# Patient Record
Sex: Female | Born: 1935 | Race: White | Hispanic: No | Marital: Married | Smoking: Never smoker
Health system: Southern US, Community
[De-identification: ages and names within clinical notes are randomized; demographics above are authoritative.]

## PROBLEM LIST (undated history)

## (undated) HISTORY — PX: GASTRIC BYPASS: SHX52

## (undated) HISTORY — PX: EYE SURGERY: SHX253

## (undated) HISTORY — PX: CHOLECYSTECTOMY: SHX55

## (undated) HISTORY — PX: CORONARY ANGIOPLASTY WITH STENT PLACEMENT: SHX49

## (undated) HISTORY — PX: ABDOMINAL HYSTERECTOMY: SHX81

## (undated) HISTORY — PX: LUNG CANCER SURGERY: SHX702

---

## 2014-04-02 ENCOUNTER — Emergency Department (HOSPITAL_COMMUNITY)
Admission: EM | Admit: 2014-04-02 | Discharge: 2014-04-02 | Disposition: A | Discharge status: Home / Self Care | Payer: Medicare (Managed Care) | Attending: Emergency Medicine | Admitting: Emergency Medicine

## 2014-04-02 ENCOUNTER — Emergency Department (HOSPITAL_COMMUNITY): Payer: Medicare (Managed Care)

## 2014-04-02 ENCOUNTER — Encounter (HOSPITAL_COMMUNITY): Payer: Self-pay | Admitting: Cardiology

## 2014-04-02 DIAGNOSIS — Y998 Other external cause status: Secondary | ICD-10-CM | POA: Diagnosis not present

## 2014-04-02 DIAGNOSIS — S79912A Unspecified injury of left hip, initial encounter: Secondary | ICD-10-CM | POA: Diagnosis not present

## 2014-04-02 DIAGNOSIS — Z9861 Coronary angioplasty status: Secondary | ICD-10-CM | POA: Diagnosis not present

## 2014-04-02 DIAGNOSIS — Z791 Long term (current) use of non-steroidal anti-inflammatories (NSAID): Secondary | ICD-10-CM | POA: Diagnosis not present

## 2014-04-02 DIAGNOSIS — Z8739 Personal history of other diseases of the musculoskeletal system and connective tissue: Secondary | ICD-10-CM | POA: Diagnosis not present

## 2014-04-02 DIAGNOSIS — W010XXA Fall on same level from slipping, tripping and stumbling without subsequent striking against object, initial encounter: Secondary | ICD-10-CM | POA: Diagnosis not present

## 2014-04-02 DIAGNOSIS — M25559 Pain in unspecified hip: Secondary | ICD-10-CM

## 2014-04-02 DIAGNOSIS — Z96643 Presence of artificial hip joint, bilateral: Secondary | ICD-10-CM | POA: Insufficient documentation

## 2014-04-02 DIAGNOSIS — S79911A Unspecified injury of right hip, initial encounter: Secondary | ICD-10-CM | POA: Diagnosis not present

## 2014-04-02 DIAGNOSIS — S79922A Unspecified injury of left thigh, initial encounter: Secondary | ICD-10-CM | POA: Diagnosis not present

## 2014-04-02 DIAGNOSIS — Z79899 Other long term (current) drug therapy: Secondary | ICD-10-CM | POA: Insufficient documentation

## 2014-04-02 DIAGNOSIS — Y9289 Other specified places as the place of occurrence of the external cause: Secondary | ICD-10-CM | POA: Diagnosis not present

## 2014-04-02 DIAGNOSIS — I2510 Atherosclerotic heart disease of native coronary artery without angina pectoris: Secondary | ICD-10-CM | POA: Insufficient documentation

## 2014-04-02 DIAGNOSIS — Y9389 Activity, other specified: Secondary | ICD-10-CM | POA: Diagnosis not present

## 2014-04-02 MED ORDER — OXYCODONE-ACETAMINOPHEN 5-325 MG PO TABS: 1.0000 | ORAL_TABLET | ORAL | Status: AC | PRN

## 2014-04-02 MED ORDER — IBUPROFEN 800 MG PO TABS: 800.0000 mg | ORAL_TABLET | Freq: Three times a day (TID) | ORAL | Status: DC

## 2014-04-02 MED ORDER — OXYCODONE-ACETAMINOPHEN 5-325 MG PO TABS: 1.0000 | ORAL_TABLET | ORAL | Status: DC | PRN

## 2014-04-02 NOTE — ED Notes (Signed)
Patient ambulatory to restroom  ?

## 2014-04-02 NOTE — ED Notes (Signed)
Larey SeatFell a few days ago.  C/o left hip pain.

## 2014-04-02 NOTE — ED Notes (Signed)
MD at bedside. 

## 2014-04-02 NOTE — ED Provider Notes (Signed)
CSN: [id number]     Arrival date & time 04/02/14  [id number] History  This chart was scribed for Glynn OctaveStephen Alezander Dimaano, MD by Abel PrestoKara Demonbreun, ED Scribe. This patient was seen in room APA05/[facility]05 and the patient's care was started at 10:16 AM.    Chief Complaint  Patient presents with  . Hip Pain    The history is provided by the patient and a relative. No language interpreter was used.    HPI Comments: Rochele PagesMildred R Dehaas is a 78 y.o. female who presents to the Emergency Department complaining of a fall one week ago. Pt states she tripped on a cement curb. Pt states she landed right on her hip. Pt's son noted she was unable to ambulate right away. Pt denies dizziness and LOC.  She notes initial left arm pain which has resolved.   Pt notes associated lateral left hip pain that radiates to her groin with a frequent popping sound heard. Pt took Aleve this morning for relief. PSH includes bilateral hip replacement in 2002. Pt notes PMHx of CAD with stents placed and sciatica. She notes she takes Crestor and Celebrex but no Plavix.  Pt notes pain does not feel like her arthritis. Pt is visiting family for the holidays and will be flying back to [place] in 3 days. Pt denies right hip pain, chest pain, back pain and any other injury.   History reviewed. No pertinent past medical history. Past Surgical History  Procedure Laterality Date  . Coronary angioplasty with stent placement    . Abdominal hysterectomy    . Cholecystectomy    . Eye surgery    . Lung cancer surgery    . Gastric bypass     History reviewed. No pertinent family history. History  Substance Use Topics  . Smoking status: Never Smoker   . Smokeless tobacco: Not on file  . Alcohol Use: No   OB History    No data available     Review of Systems  Cardiovascular: Negative for chest pain.  Musculoskeletal: Positive for arthralgias. Negative for back pain.  Neurological: Negative for dizziness and syncope.   A complete 10 system review of  systems was obtained and all systems are negative except as noted in the HPI and PMH.     Allergies  Prednisone  Home Medications   Prior to Admission medications   Medication Sig Start Date End Date Taking? Authorizing Provider  CALCIUM PO Take 1 tablet by mouth daily.   Yes Historical Provider, MD  celecoxib (CELEBREX) 200 MG capsule Take 1 capsule by mouth daily. 12/31/13  Yes Historical Provider, MD  CRESTOR 10 MG tablet Take 1 tablet by mouth at bedtime. 03/02/14  Yes Historical Provider, MD  Cyanocobalamin (VITAMIN B-12 PO) Take 1 tablet by mouth daily.   Yes Historical Provider, MD  Magnesium Oxide (MAG-OX PO) Take 1 tablet by mouth daily.   Yes Historical Provider, MD  Multiple Vitamins-Minerals (HAIR/SKIN/NAILS PO) Take 1 tablet by mouth daily.   Yes Historical Provider, MD  Multiple Vitamins-Minerals (MULTIVITAMIN PO) Take 1 tablet by mouth daily.   Yes Historical Provider, MD  NAMENDA XR 28 MG CP24 Take 1 tablet by mouth daily. 02/09/14  Yes Historical Provider, MD  naproxen sodium (ANAPROX) 220 MG tablet Take 440 mg by mouth 2 (two) times daily with a meal.   Yes Historical Provider, MD  sertraline (ZOLOFT) 50 MG tablet Take 1 tablet by mouth daily. 01/26/14  Yes Historical Provider, MD  SYNTHROID 75 MCG tablet Take  1 tablet by mouth at bedtime.  01/26/14  Yes Historical Provider, MD  vitamin E 400 UNIT capsule Take 400 Units by mouth daily.   Yes Historical Provider, MD  oxyCODONE-acetaminophen (PERCOCET/ROXICET) 5-325 MG per tablet Take 1 tablet by mouth every 4 (four) hours as needed for severe pain. 04/02/14   Glynn OctaveStephen Alayasia Breeding, MD   BP 127/57 mmHg  Pulse 55  Resp 18  SpO2 97%  LMP  Physical Exam  Constitutional: She is oriented to person, place, and time. She appears well-developed and well-nourished. No distress.  HENT:  Head: Normocephalic and atraumatic.  Mouth/Throat: Oropharynx is clear and moist. No oropharyngeal exudate.  Eyes: Conjunctivae and EOM are normal.  Pupils are equal, round, and reactive to light.  Neck: Normal range of motion. Neck supple.  No meningismus.  Cardiovascular: Normal rate, regular rhythm, normal heart sounds and intact distal pulses.   No murmur heard. Intact distal pulses  Pulmonary/Chest: Effort normal and breath sounds normal. No respiratory distress.  Abdominal: Soft. There is no tenderness. There is no rebound and no guarding.  Musculoskeletal: Normal range of motion. She exhibits tenderness. She exhibits no edema.       Left hip: She exhibits tenderness (left lateral hip and thigh). She exhibits normal range of motion.       Left knee: She exhibits normal range of motion.       Left ankle: She exhibits normal range of motion.       Lumbar back: She exhibits no tenderness.  No distal femur tenderness  Neurological: She is alert and oriented to person, place, and time. No cranial nerve deficit. She exhibits normal muscle tone. Coordination normal.  No ataxia on finger to nose bilaterally. No pronator drift. 5/5 strength throughout. CN 2-12 intact. Negative Romberg. Equal grip strength. Sensation intact. Gait is antalgic.   Skin: Skin is warm.  Psychiatric: She has a normal mood and affect. Her behavior is normal.  Nursing note and vitals reviewed.   ED Course  Procedures (including critical care time) DIAGNOSTIC STUDIES: Oxygen Saturation is 97% on room air, normal by my interpretation.    COORDINATION OF CARE: 10:23 AM Discussed treatment plan with patient at beside, the patient agrees with the plan and has no further questions at this time.   Labs Review Labs Reviewed - No data to display  Imaging Review Dg Lumbar Spine Complete  04/02/2014   CLINICAL DATA:  Left hip pain and back pain for 10 days after a fall. History of bilateral total hip arthroplasties.  EXAM: LEFT HIP - COMPLETE 2+ VIEW; LUMBAR SPINE - COMPLETE 4+ VIEW  COMPARISON:  None.  FINDINGS: Lumbar spine:  Normal alignment of the lumbar  vertebral bodies. Disc spaces and vertebral bodies are maintained. No definite acute lumbar compression fracture. There is moderate to advanced facet disease in the lower lumbar spine without definite pars defects. Surgical changes are noted in the abdomen. The visualized bony pelvis is intact.  Left hip:  There are bilateral hip prosthesis. Bony resorptive changes versus surgical changes involving the medial calcar. There is also mild lucencies in the medial and inferior aspect of the left acetabulum. No other complicating features associated with the prosthesis. No periprosthetic fracture. The pubic symphysis and SI joints are intact. No pelvic fractures.  IMPRESSION: Advanced facet disease involving the lumbar spine but no acute bony findings.  Bony resorptive changes versus postsurgical changes involving the medial inferior acetabulum and medial calcar on the left. No periprosthetic fracture or pelvic  fracture.   Electronically Signed   By: Loralie Champagne M.D.   On: 04/02/2014 11:06   Dg Hip Complete Left  04/02/2014   CLINICAL DATA:  Left hip pain and back pain for 10 days after a fall. History of bilateral total hip arthroplasties.  EXAM: LEFT HIP - COMPLETE 2+ VIEW; LUMBAR SPINE - COMPLETE 4+ VIEW  COMPARISON:  None.  FINDINGS: Lumbar spine:  Normal alignment of the lumbar vertebral bodies. Disc spaces and vertebral bodies are maintained. No definite acute lumbar compression fracture. There is moderate to advanced facet disease in the lower lumbar spine without definite pars defects. Surgical changes are noted in the abdomen. The visualized bony pelvis is intact.  Left hip:  There are bilateral hip prosthesis. Bony resorptive changes versus surgical changes involving the medial calcar. There is also mild lucencies in the medial and inferior aspect of the left acetabulum. No other complicating features associated with the prosthesis. No periprosthetic fracture. The pubic symphysis and SI joints are  intact. No pelvic fractures.  IMPRESSION: Advanced facet disease involving the lumbar spine but no acute bony findings.  Bony resorptive changes versus postsurgical changes involving the medial inferior acetabulum and medial calcar on the left. No periprosthetic fracture or pelvic fracture.   Electronically Signed   By: Loralie Champagne M.D.   On: 04/02/2014 11:06     EKG Interpretation None      MDM   Final diagnoses:  Hip pain   Fall 1 week ago with persistent left hip pain. History of bilateral hip replacements. Visiting from [place]. Did not hit head or lose consciousness.  Intact distal pulses. Tenderness to palpation of left lateral hip and proximal femur. No distal femur tenderness Patient able to ambulate with antalgic gait X-rays are negative for acute fracture or dislocation.  Discussed with patient and son. Offered CT scan to further evaluate for subtle pelvic fracture. They decline stating they will follow up with her orthopedist on Monday. Patient is able to ambulate. she has declined pain medication the ED. Her pain is controlled.      Glynn Octave, MD 04/02/14 [phone]

## 2014-04-02 NOTE — Discharge Instructions (Signed)
Arthralgia Take naproxen for your pain every 12 hours. Takes oxycodone in between as needed for severe pain. Return to your doctor in [place] for a recheck next week. Return to the ED if you develop worsening symptoms or  Are unable to walk.  Your caregiver has diagnosed you as suffering from an arthralgia. Arthralgia means there is pain in a joint. This can come from many reasons including:  Bruising the joint which causes soreness (inflammation) in the joint.  Wear and tear on the joints which occur as we grow older (osteoarthritis).  Overusing the joint.  Various forms of arthritis.  Infections of the joint. Regardless of the cause of pain in your joint, most of these different pains respond to anti-inflammatory drugs and rest. The exception to this is when a joint is infected, and these cases are treated with antibiotics, if it is a bacterial infection. HOME CARE INSTRUCTIONS   Rest the injured area for as long as directed by your caregiver. Then slowly start using the joint as directed by your caregiver and as the pain allows. Crutches as directed may be useful if the ankles, knees or hips are involved. If the knee was splinted or casted, continue use and care as directed. If an stretchy or elastic wrapping bandage has been applied today, it should be removed and re-applied every 3 to 4 hours. It should not be applied tightly, but firmly enough to keep swelling down. Watch toes and feet for swelling, bluish discoloration, coldness, numbness or excessive pain. If any of these problems (symptoms) occur, remove the ace bandage and re-apply more loosely. If these symptoms persist, contact your caregiver or return to this location.  For the first 24 hours, keep the injured extremity elevated on pillows while lying down.  Apply ice for 15-20 minutes to the sore joint every couple hours while awake for the first half day. Then 03-04 times per day for the first 48 hours. Put the ice in a plastic bag  and place a towel between the bag of ice and your skin.  Wear any splinting, casting, elastic bandage applications, or slings as instructed.  Only take over-the-counter or prescription medicines for pain, discomfort, or fever as directed by your caregiver. Do not use aspirin immediately after the injury unless instructed by your physician. Aspirin can cause increased bleeding and bruising of the tissues.  If you were given crutches, continue to use them as instructed and do not resume weight bearing on the sore joint until instructed. Persistent pain and inability to use the sore joint as directed for more than 2 to 3 days are warning signs indicating that you should see a caregiver for a follow-up visit as soon as possible. Initially, a hairline fracture (break in bone) may not be evident on X-rays. Persistent pain and swelling indicate that further evaluation, non-weight bearing or use of the joint (use of crutches or slings as instructed), or further X-rays are indicated. X-rays may sometimes not show a small fracture until a week or 10 days later. Make a follow-up appointment with your own caregiver or one to whom we have referred you. A radiologist (specialist in reading X-rays) may read your X-rays. Make sure you know how you are to obtain your X-ray results. Do not assume everything is normal if you do not hear from us. SEEK MEDICAL CARE IF: Bruising, swelling, or pain increases. SEEK IMMEDIATE MEDICAL CARE IF:   Your fingers or toes are numb or blue.  The pain is not  responding to medications and continues to stay the same or get worse.  The pain in your joint becomes severe.  You develop a fever over 102 F (38.9 C).  It becomes impossible to move or use the joint. MAKE SURE YOU:   Understand these instructions.  Will watch your condition.  Will get help right away if you are not doing well or get worse. Document Released: 03/20/2005 Document Revised: 06/12/2011 Document  Reviewed: 11/06/2007 [facility] Patient Information 2015 [employer] [place] [place]. This information is not intended to replace advice given to you by your health care provider. Make sure you discuss any questions you have with your health care provider.

## 2016-01-17 IMAGING — CR DG HIP (WITH OR WITHOUT PELVIS) 2-3V*L*
3 series · 3 of 3 positions shown · non-contrast
Comparison: None.

CLINICAL DATA: Left hip pain and back pain for 10 days after a
fall. History of bilateral total hip arthroplasties.

EXAM:
LEFT HIP - COMPLETE 2+ VIEW; LUMBAR SPINE - COMPLETE 4+ VIEW

[view not recorded (1 of 3)]
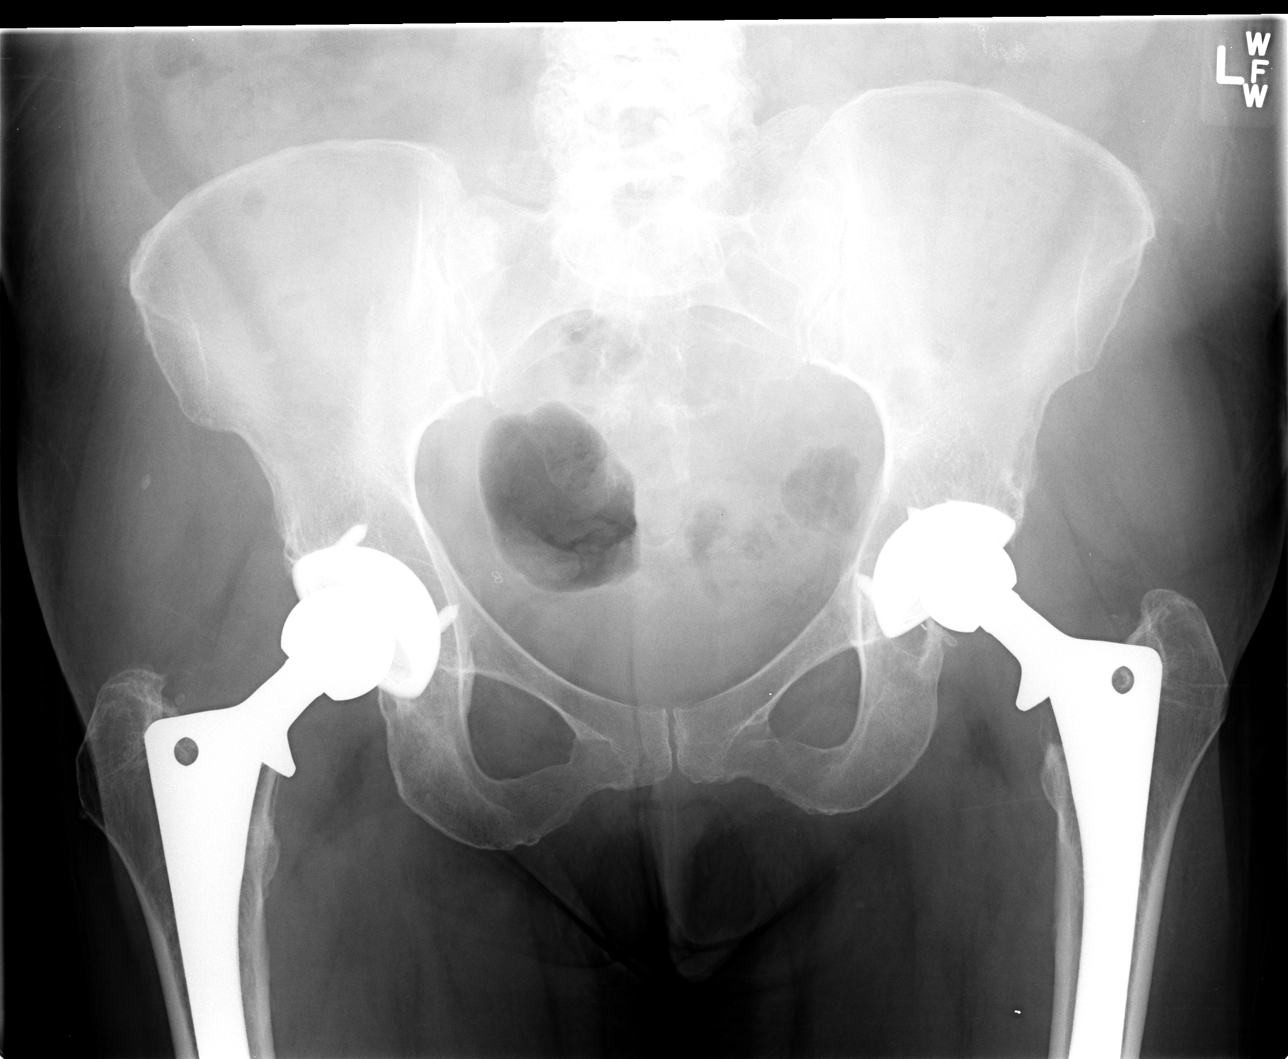

[view not recorded (2 of 3)]
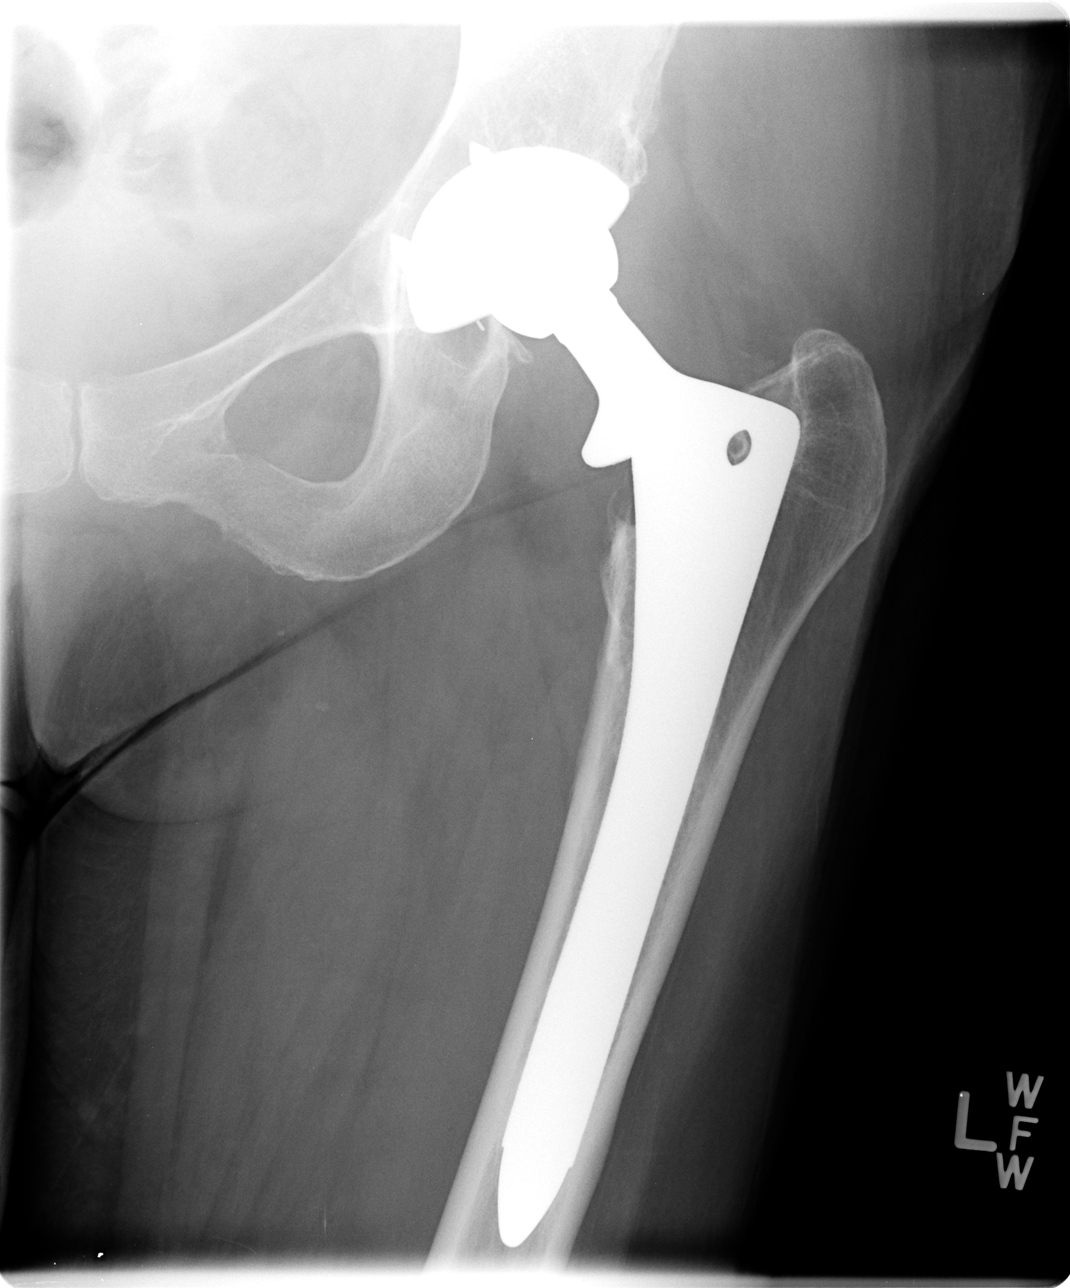

[view not recorded (3 of 3)]
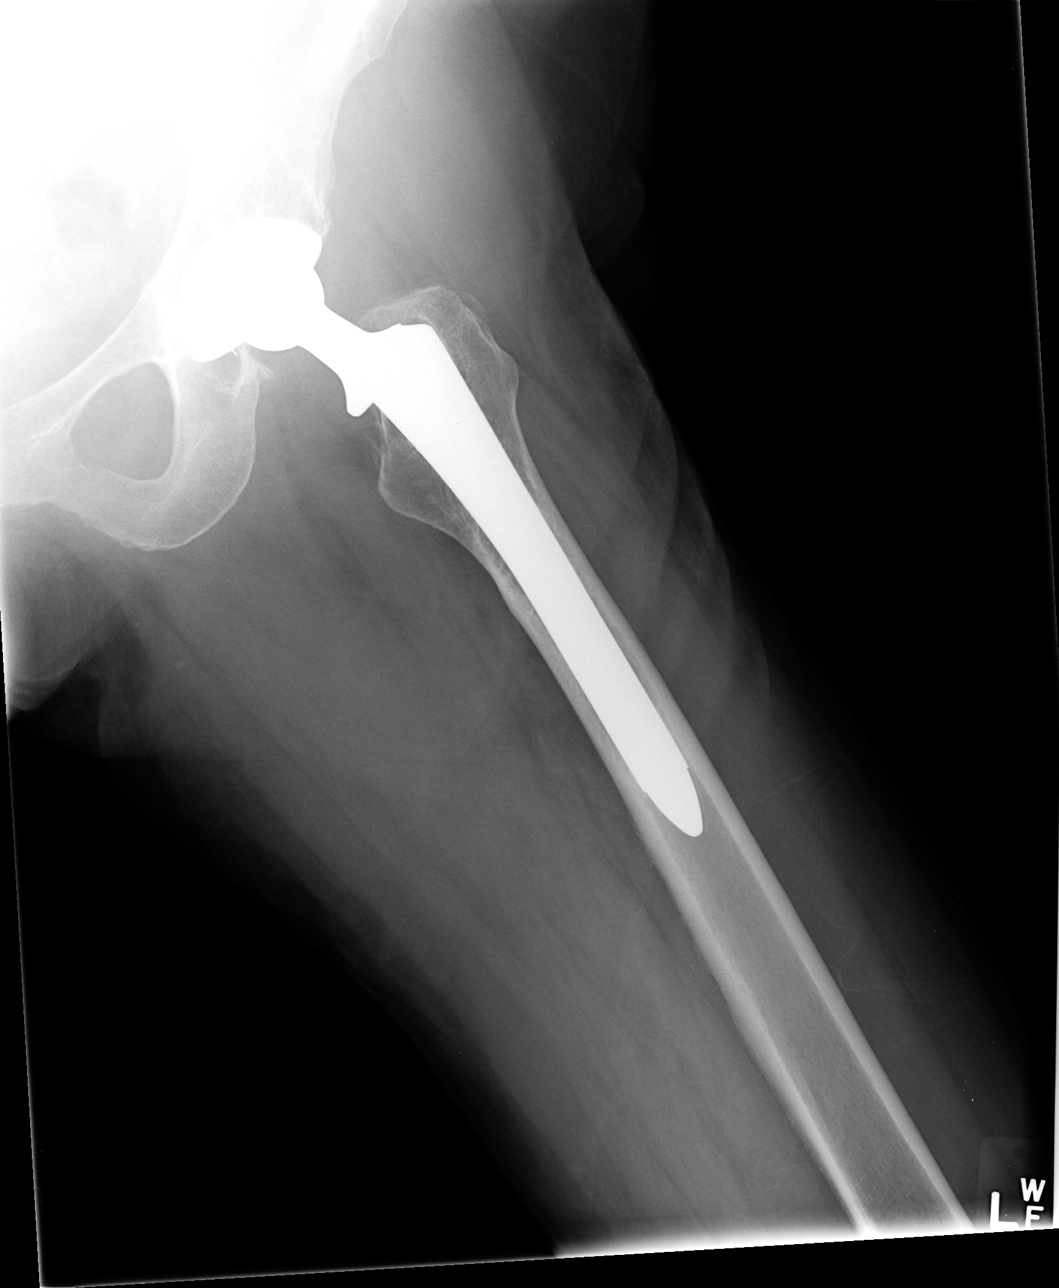

[3 of 3 positions shown; findings below may reference images not displayed]

FINDINGS: Lumbar spine:

Normal alignment of the lumbar vertebral bodies. Disc spaces and
vertebral bodies are maintained. No definite acute lumbar
compression fracture. There is moderate to advanced facet disease in
the lower lumbar spine without definite pars defects. Surgical
changes are noted in the abdomen. The visualized bony pelvis is
intact.

Left hip:

There are bilateral hip prosthesis. Bony resorptive changes versus
surgical changes involving the medial calcar. There is also mild
lucencies in the medial and inferior aspect of the left acetabulum.
No other complicating features associated with the prosthesis. No
periprosthetic fracture. The pubic symphysis and SI joints are
intact. No pelvic fractures.
IMPRESSION: Advanced facet disease involving the lumbar spine but no acute bony
findings.

Bony resorptive changes versus postsurgical changes involving the
medial inferior acetabulum and medial calcar on the left. No
periprosthetic fracture or pelvic fracture.

## 2016-01-17 IMAGING — CR DG LUMBAR SPINE COMPLETE 4+V
5 series · 5 of 5 positions shown · non-contrast
Comparison: None.

CLINICAL DATA: Left hip pain and back pain for 10 days after a
fall. History of bilateral total hip arthroplasties.

EXAM:
LEFT HIP - COMPLETE 2+ VIEW; LUMBAR SPINE - COMPLETE 4+ VIEW

[view not recorded (1 of 5)]
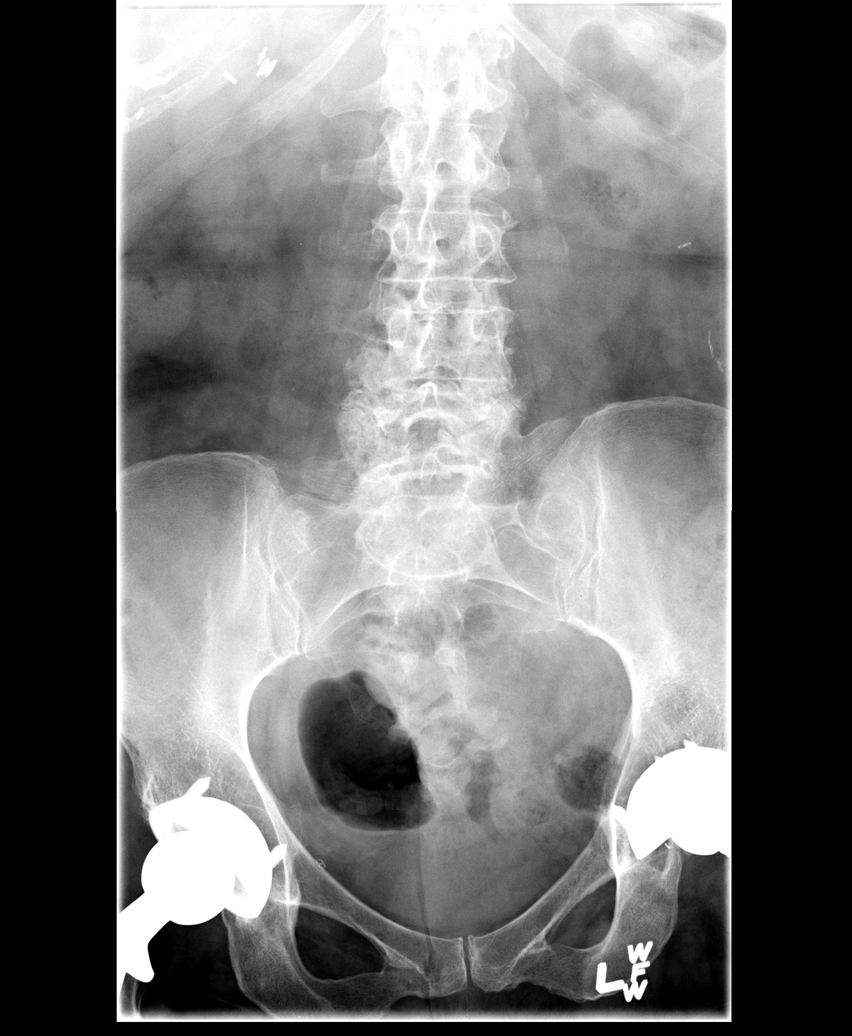

[view not recorded (2 of 5)]
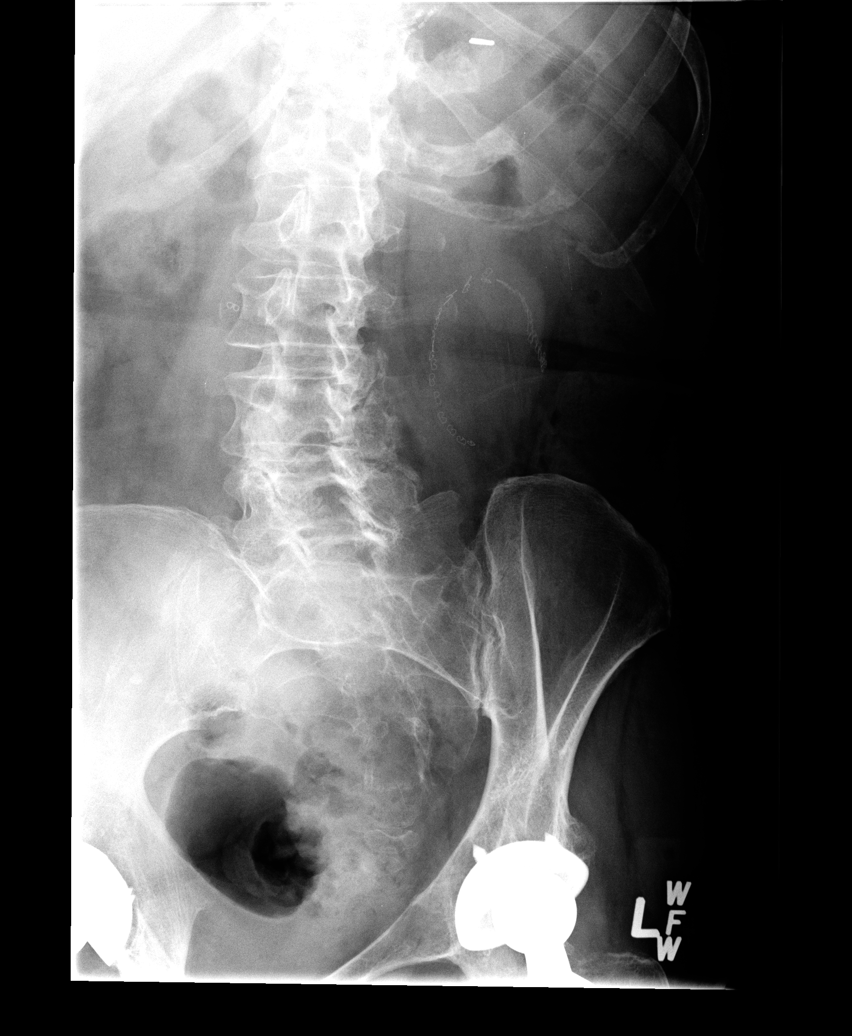

[view not recorded (3 of 5)]
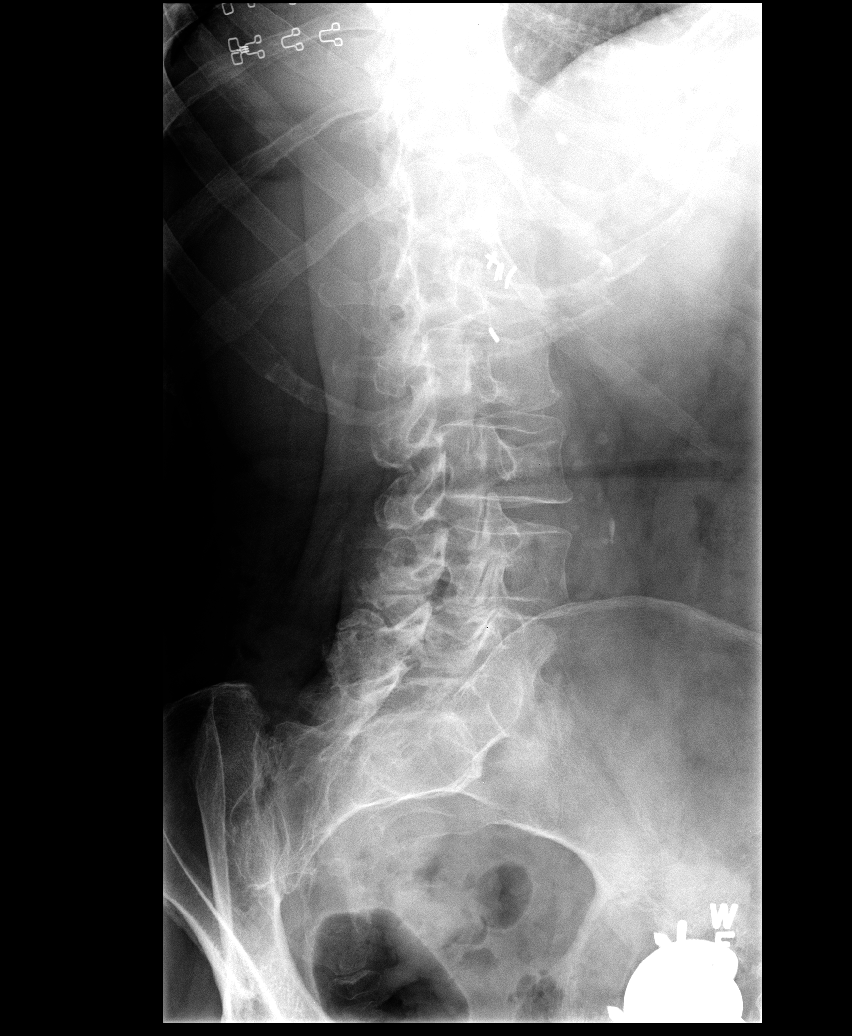

[view not recorded (4 of 5)]
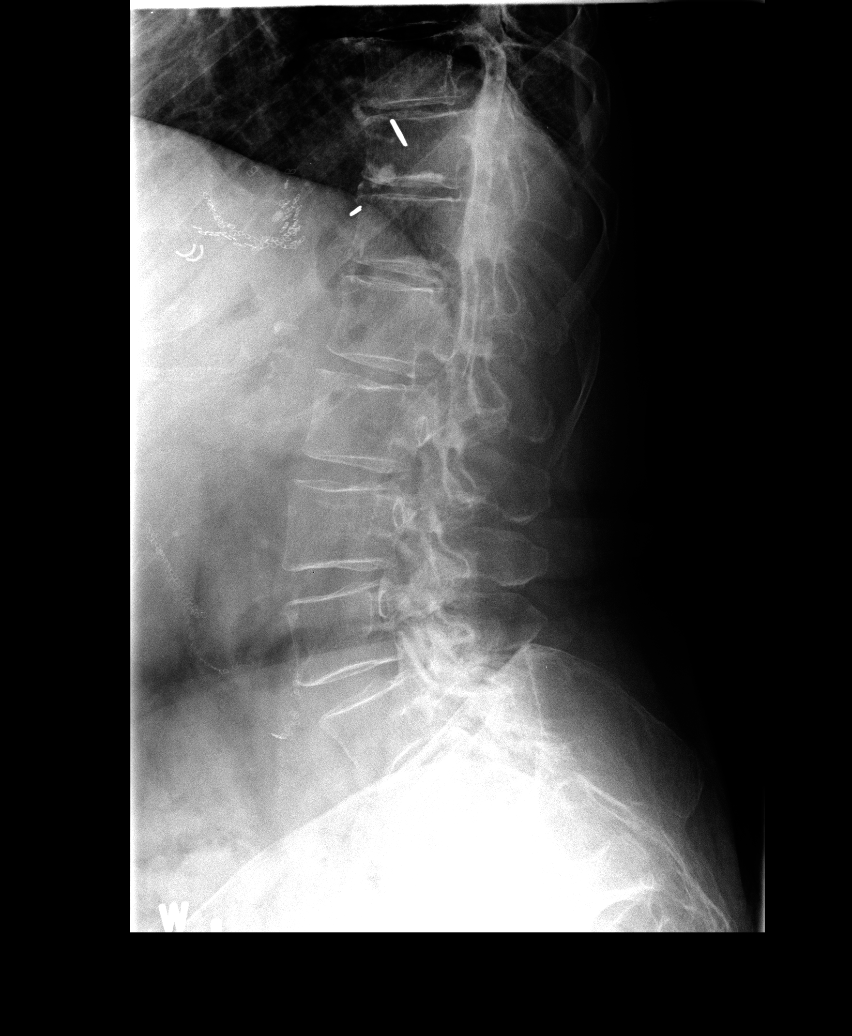

[view not recorded (5 of 5)]
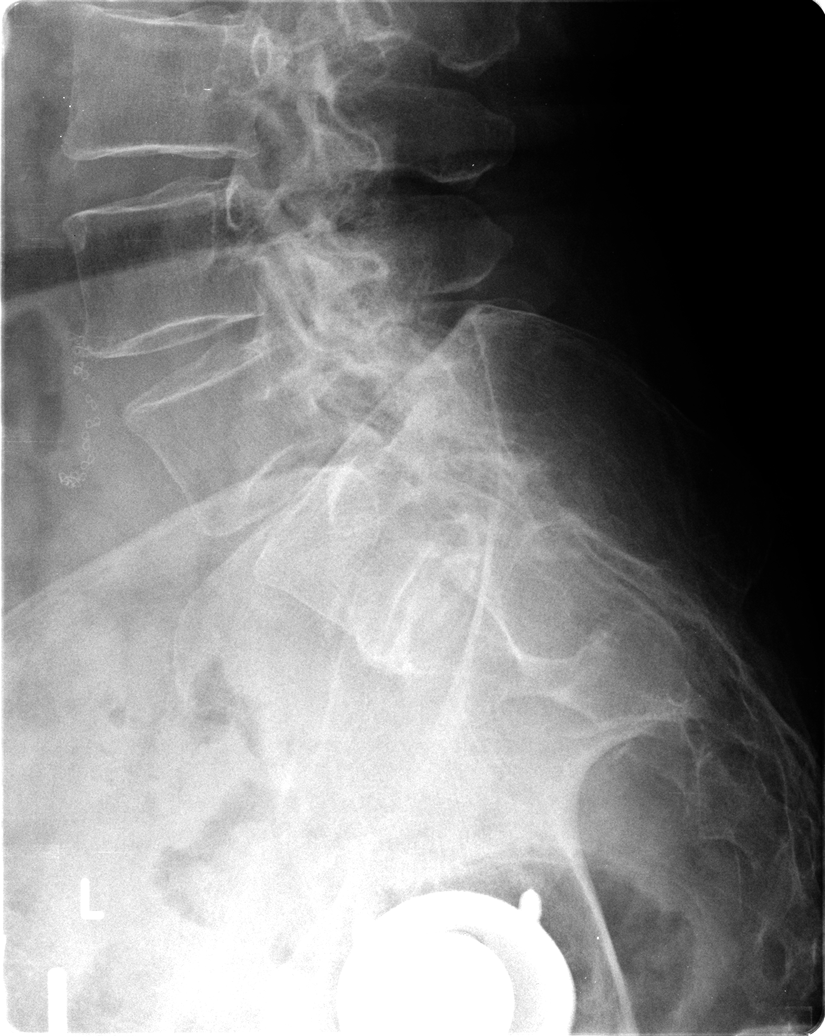

[5 of 5 positions shown; findings below may reference images not displayed]

FINDINGS: Lumbar spine:

Normal alignment of the lumbar vertebral bodies. Disc spaces and
vertebral bodies are maintained. No definite acute lumbar
compression fracture. There is moderate to advanced facet disease in
the lower lumbar spine without definite pars defects. Surgical
changes are noted in the abdomen. The visualized bony pelvis is
intact.

Left hip:

There are bilateral hip prosthesis. Bony resorptive changes versus
surgical changes involving the medial calcar. There is also mild
lucencies in the medial and inferior aspect of the left acetabulum.
No other complicating features associated with the prosthesis. No
periprosthetic fracture. The pubic symphysis and SI joints are
intact. No pelvic fractures.
IMPRESSION: Advanced facet disease involving the lumbar spine but no acute bony
findings.

Bony resorptive changes versus postsurgical changes involving the
medial inferior acetabulum and medial calcar on the left. No
periprosthetic fracture or pelvic fracture.
